# Patient Record
Sex: Female | Born: 1995 | Race: White | Hispanic: No | Marital: Single | Smoking: Never smoker
Health system: Southern US, Community
[De-identification: ages and names within clinical notes are randomized; demographics above are authoritative.]

## PROBLEM LIST (undated history)

## (undated) DIAGNOSIS — F4310 Post-traumatic stress disorder, unspecified: Secondary | ICD-10-CM

## (undated) DIAGNOSIS — L509 Urticaria, unspecified: Secondary | ICD-10-CM

## (undated) DIAGNOSIS — F419 Anxiety disorder, unspecified: Secondary | ICD-10-CM

## (undated) DIAGNOSIS — E348 Other specified endocrine disorders: Secondary | ICD-10-CM

## (undated) DIAGNOSIS — F329 Major depressive disorder, single episode, unspecified: Secondary | ICD-10-CM

## (undated) DIAGNOSIS — J45909 Unspecified asthma, uncomplicated: Secondary | ICD-10-CM

## (undated) DIAGNOSIS — E0500 Thyrotoxicosis with diffuse goiter without thyrotoxic crisis or storm: Secondary | ICD-10-CM

## (undated) DIAGNOSIS — F32A Depression, unspecified: Secondary | ICD-10-CM

## (undated) DIAGNOSIS — T7840XA Allergy, unspecified, initial encounter: Secondary | ICD-10-CM

## (undated) DIAGNOSIS — F429 Obsessive-compulsive disorder, unspecified: Secondary | ICD-10-CM

## (undated) HISTORY — DX: Post-traumatic stress disorder, unspecified: F43.10

## (undated) HISTORY — PX: TONSILLECTOMY: SUR1361

## (undated) HISTORY — DX: Urticaria, unspecified: L50.9

## (undated) HISTORY — DX: Unspecified asthma, uncomplicated: J45.909

## (undated) HISTORY — DX: Anxiety disorder, unspecified: F41.9

## (undated) HISTORY — DX: Allergy, unspecified, initial encounter: T78.40XA

## (undated) HISTORY — PX: OTHER SURGICAL HISTORY: SHX169

## (undated) HISTORY — DX: Major depressive disorder, single episode, unspecified: F32.9

## (undated) HISTORY — DX: Depression, unspecified: F32.A

---
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
## 2017-01-02 ENCOUNTER — Telehealth: Payer: Self-pay | Admitting: Pediatrics

## 2017-01-02 NOTE — Telephone Encounter (Signed)
Please send an itemized statement for last year via mail. Needed for flex card. Thanks

## 2017-06-22 ENCOUNTER — Other Ambulatory Visit: Payer: Self-pay | Admitting: Allergy & Immunology

## 2017-07-25 ENCOUNTER — Other Ambulatory Visit: Payer: Self-pay | Admitting: Allergy & Immunology

## 2017-08-22 ENCOUNTER — Ambulatory Visit
Admission: RE | Admit: 2017-08-22 | Discharge: 2017-08-22 | Disposition: A | Discharge status: Home / Self Care | Payer: Self-pay | Source: Ambulatory Visit | Attending: Family Medicine | Admitting: Family Medicine

## 2017-08-22 ENCOUNTER — Other Ambulatory Visit: Payer: Self-pay | Admitting: Family Medicine

## 2017-08-22 DIAGNOSIS — R1032 Left lower quadrant pain: Secondary | ICD-10-CM

## 2017-08-22 DIAGNOSIS — R109 Unspecified abdominal pain: Secondary | ICD-10-CM

## 2017-08-23 ENCOUNTER — Other Ambulatory Visit: Payer: Self-pay | Admitting: Family Medicine

## 2017-08-23 DIAGNOSIS — N390 Urinary tract infection, site not specified: Secondary | ICD-10-CM

## 2017-08-23 DIAGNOSIS — R1032 Left lower quadrant pain: Secondary | ICD-10-CM

## 2017-08-24 ENCOUNTER — Other Ambulatory Visit: Payer: Self-pay | Admitting: Family Medicine

## 2017-08-24 ENCOUNTER — Ambulatory Visit
Admission: RE | Admit: 2017-08-24 | Discharge: 2017-08-24 | Disposition: A | Discharge status: Home / Self Care | Payer: BLUE CROSS/BLUE SHIELD | Source: Ambulatory Visit | Attending: Family Medicine | Admitting: Family Medicine

## 2017-08-24 DIAGNOSIS — R109 Unspecified abdominal pain: Secondary | ICD-10-CM

## 2017-08-24 DIAGNOSIS — R1032 Left lower quadrant pain: Secondary | ICD-10-CM

## 2017-08-24 DIAGNOSIS — N390 Urinary tract infection, site not specified: Secondary | ICD-10-CM

## 2017-08-26 ENCOUNTER — Other Ambulatory Visit: Payer: Self-pay | Admitting: Allergy & Immunology

## 2017-10-09 ENCOUNTER — Other Ambulatory Visit: Payer: Self-pay | Admitting: Allergy & Immunology

## 2017-10-11 NOTE — Telephone Encounter (Signed)
OV due, No RX authorized.

## 2017-11-16 ENCOUNTER — Telehealth: Payer: Self-pay | Admitting: Endocrinology

## 2017-11-16 NOTE — Telephone Encounter (Signed)
This patients and her mother are wanting to come to our practice.  This is a Transgender patient (female to female)  Before the patient gets referred over to us they are wanting to know if the Dr. does Hormonal Replacement Therapy.  She is with another endocrinology office but is not sure if they specialize in this. Pt received our number through her therapist and gave us a call.   Please advise.  If patient or patients mother does not answer please leave a message.

## 2017-11-16 NOTE — Telephone Encounter (Signed)
I called and LVM that this was offered & if she had further questions before being referred she could call back.

## 2017-11-16 NOTE — Telephone Encounter (Signed)
Yes, we offer this

## 2019-11-05 IMAGING — US US PELVIS COMPLETE TRANSABD/TRANSVAG
1 series · 13 of 25 positions shown · non-contrast
Comparison: CT from 08/22/2017

CLINICAL DATA: Left lower quadrant pain



[Series 1: us pelvis complete transabd/transvag · 0.17mm/px · 13 of 73 slices shown]
[im 1/73]
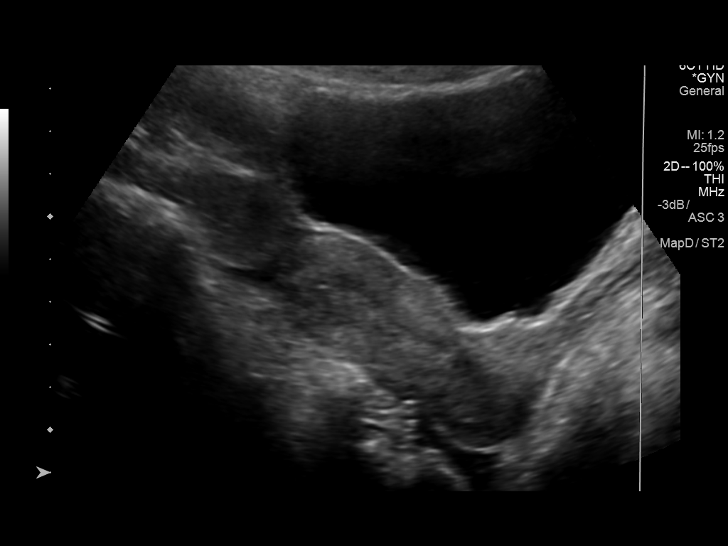
[im 7/73]
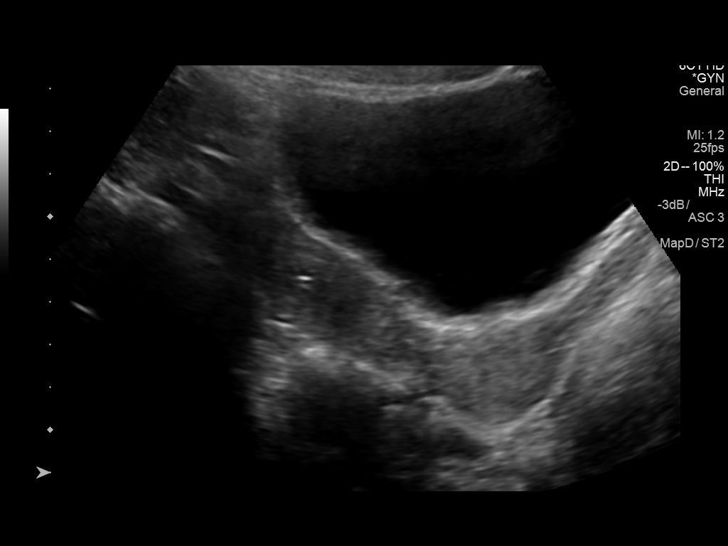
[im 13/73]
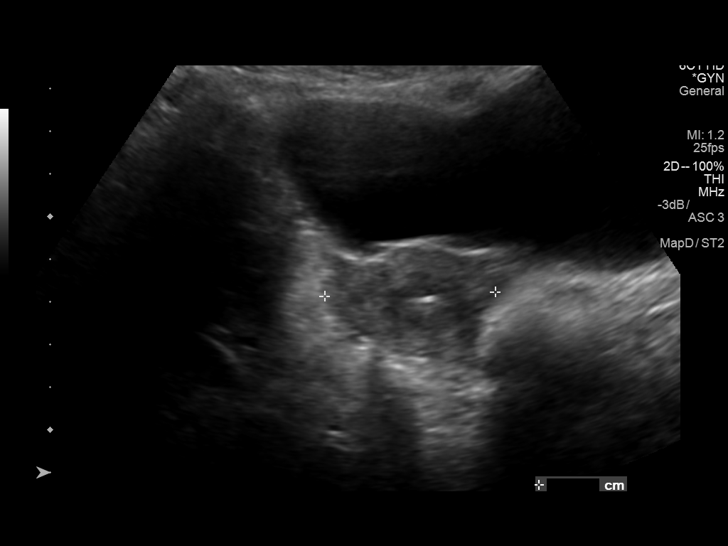
[im 19/73]
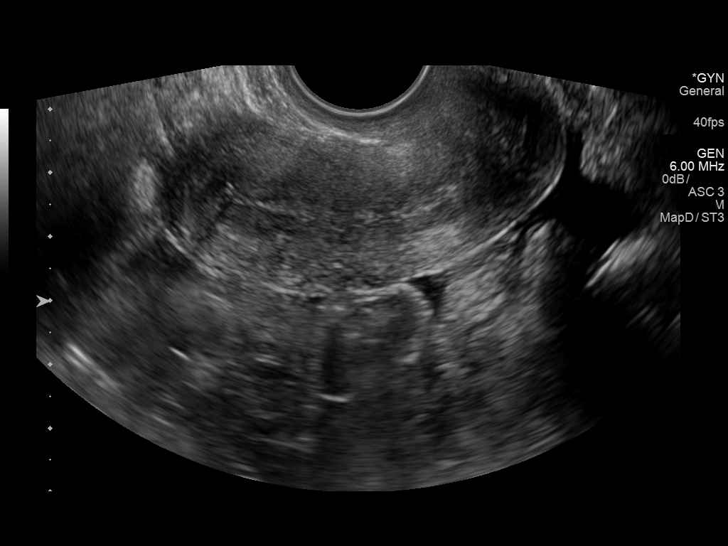
[im 25/73]
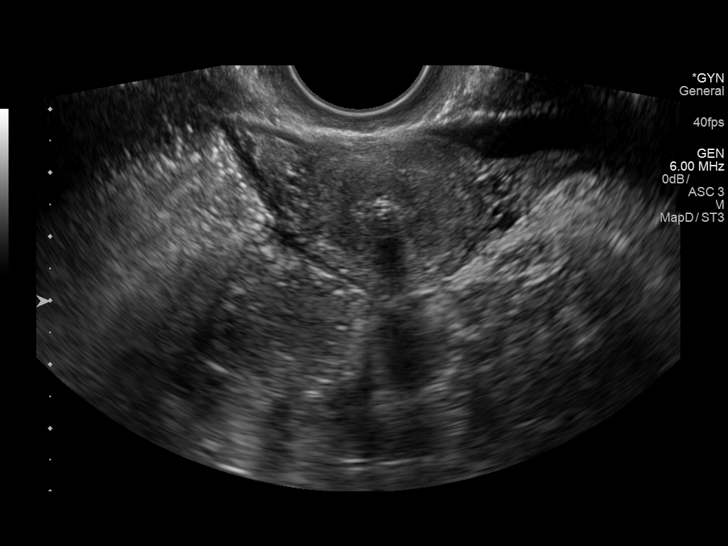
[im 31/73]
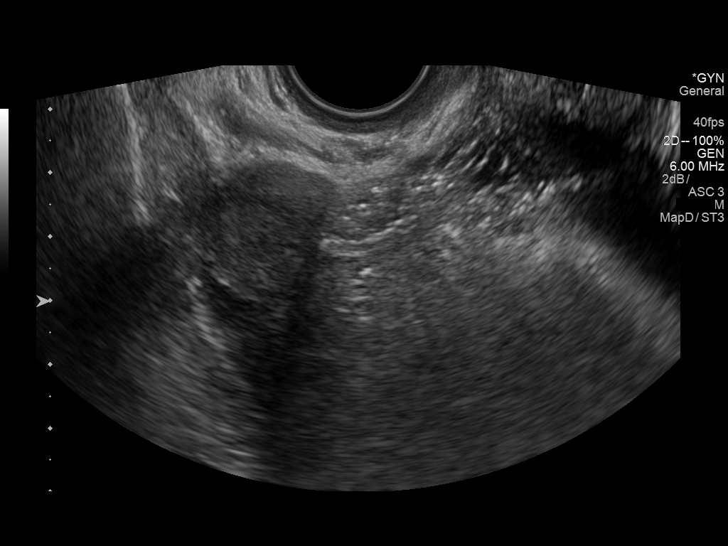
[im 37/73]
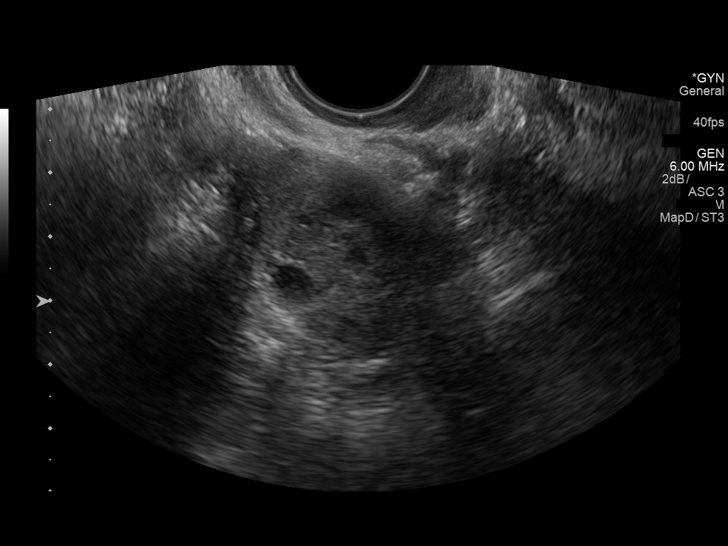
[im 43/73]
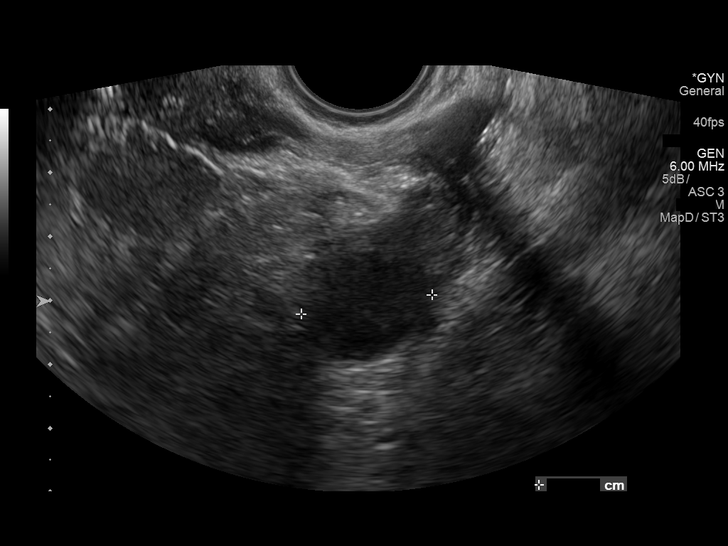
[im 49/73]
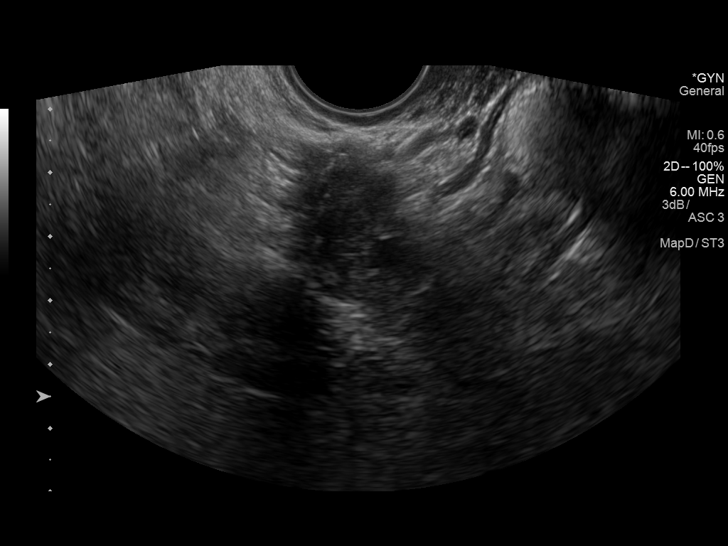
[im 55/73]
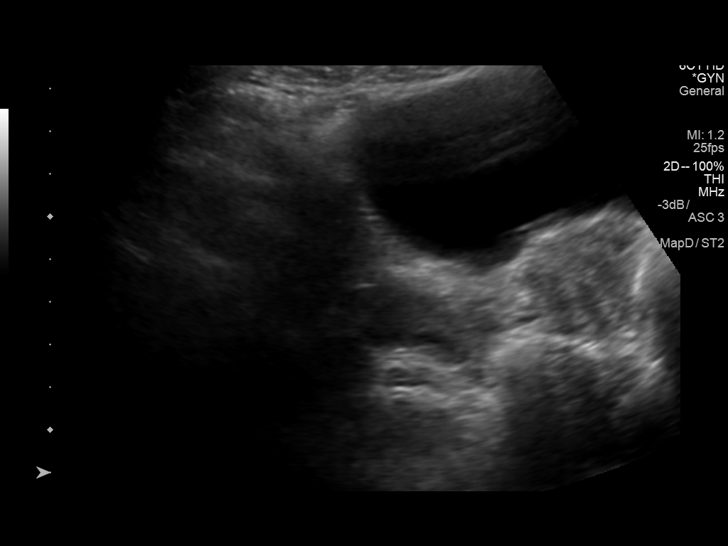
[im 61/73]
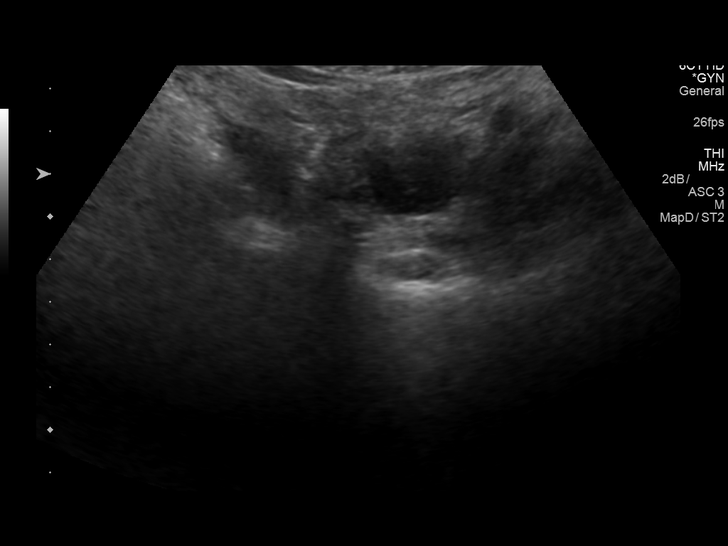
[im 67/73]
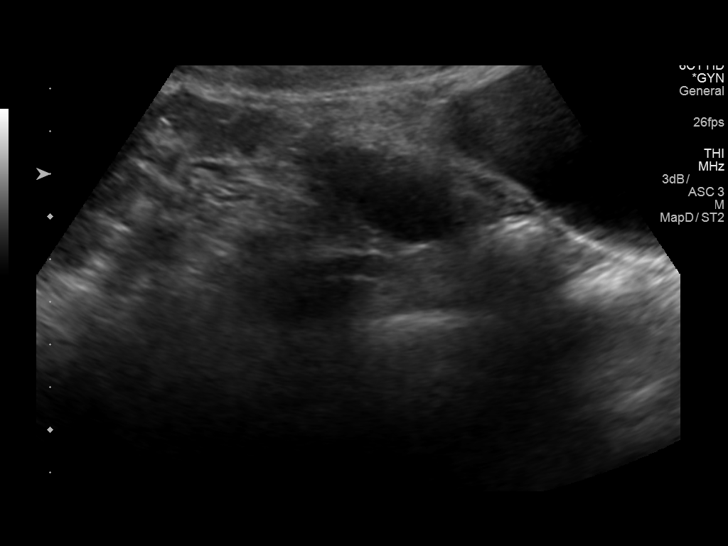
[im 73/73]
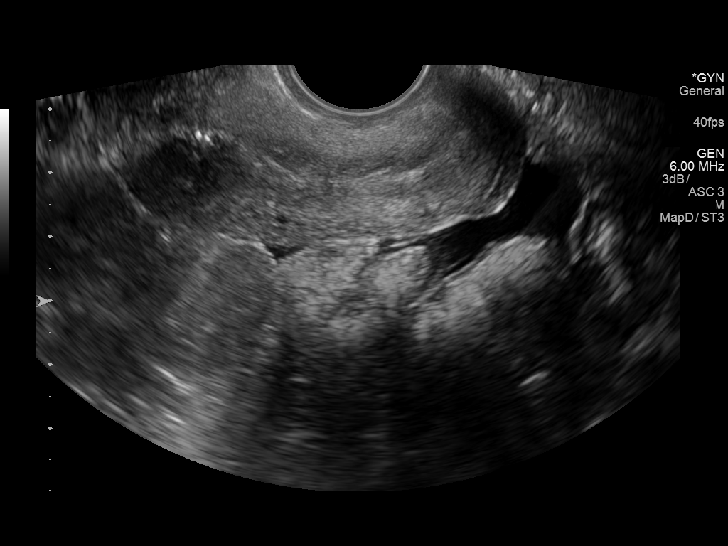

[13 of 25 positions shown; findings below may reference images not displayed]

FINDINGS: Uterus

Measurements: 6.9 x 2.6 x 4.0 cm.. No fibroids or other mass
visualized.

Endometrium

Thickness: 3.8 mm..  IUD is noted in place.

Right ovary

Measurements: 2.6 x 2.2 x 2.4 cm.. Follicular changes are noted.

Left ovary

Measurements: 3.4 x 2.1 x 3.4 cm.. Complex hypoechoic area is noted
with increased through transmission measuring 1.8 x 1.7 x 2.1 cm.
This was not as well visualized on the prior CT exam and likely
represents a complex, possibly hemorrhagic cyst.

Other findings

Mild free fluid is noted likely physiologic in nature given the
patient's age.
IMPRESSION: Complex cystic lesion in the left ovary measuring 2.1 cm. This is
likely of benign etiology given the size and patient's premenopausal
state. Follow-up examination in 2-3 months is recommended to assess
for resolution.

Minimal free pelvic fluid likely physiologic in nature.

## 2019-11-05 IMAGING — US US RENAL
1 series · 14 of 25 positions shown · non-contrast
Comparison: CT 08/22/2017

CLINICAL DATA: 21-year-old female with left flank and lower
quadrant pain for 3 days.

EXAM:
RENAL / URINARY TRACT ULTRASOUND COMPLETE

[Series 1: us renal · 0.21mm/px · 14 of 33 slices shown]
[im 1/33]
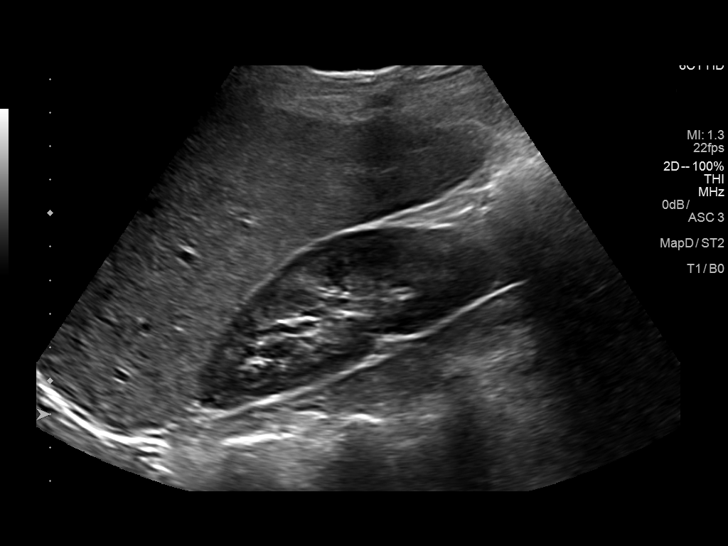
[im 3/33]
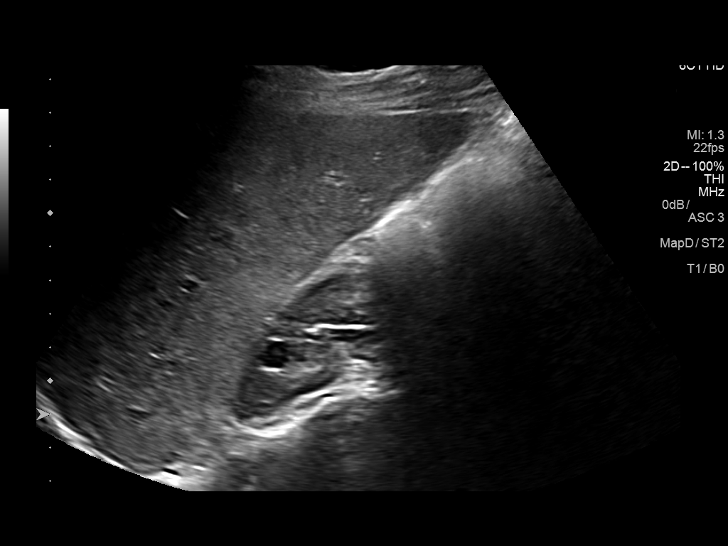
[im 6/33]
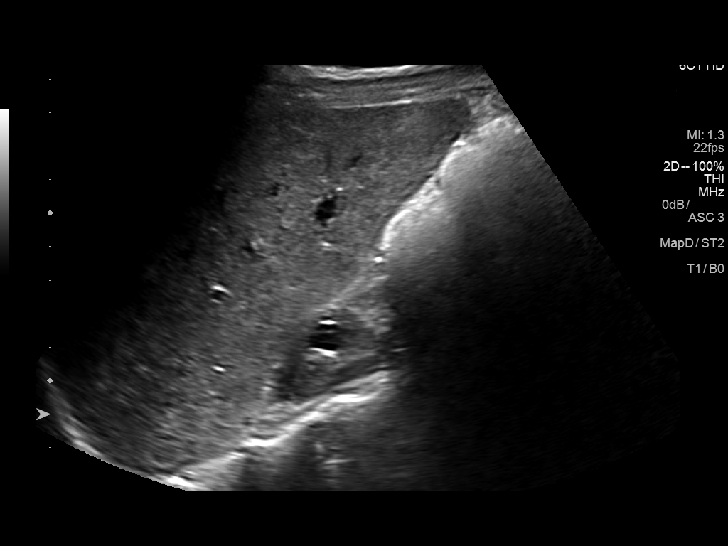
[im 9/33]
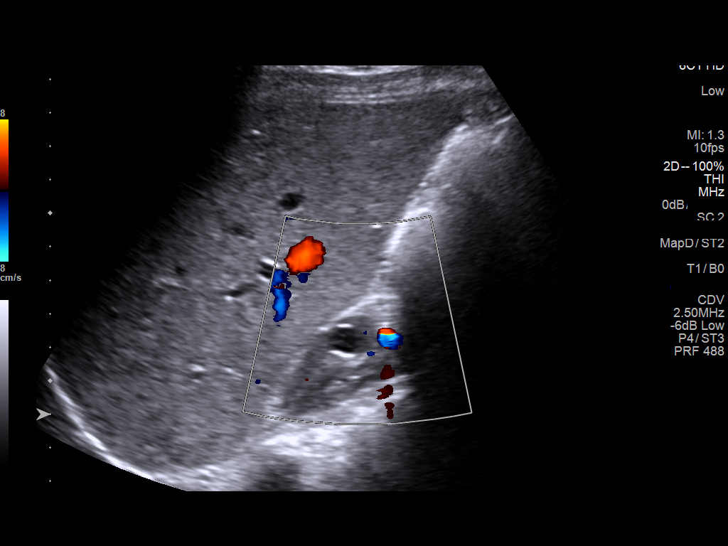
[im 11/33]
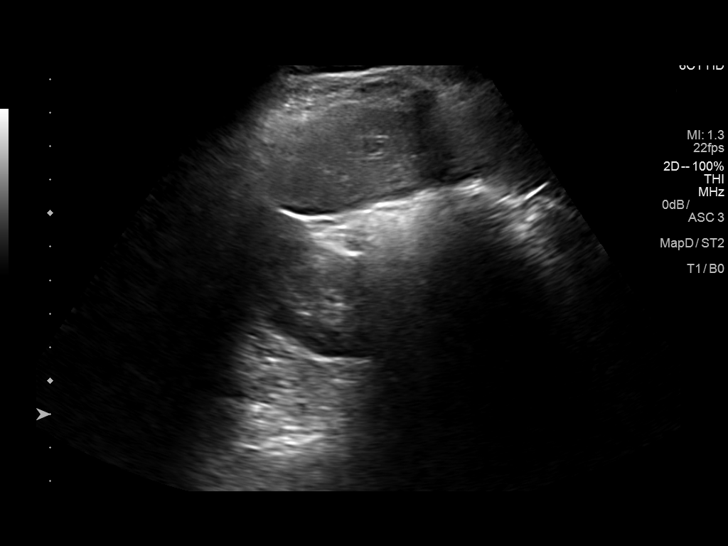
[im 13/33]
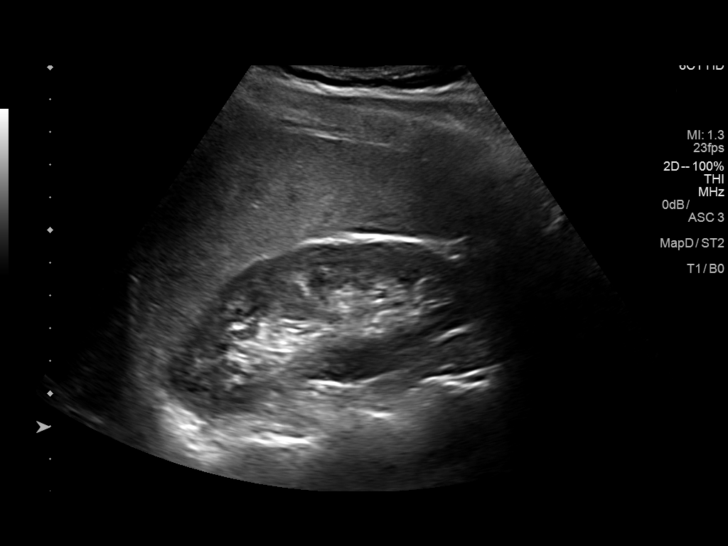
[im 15/33]
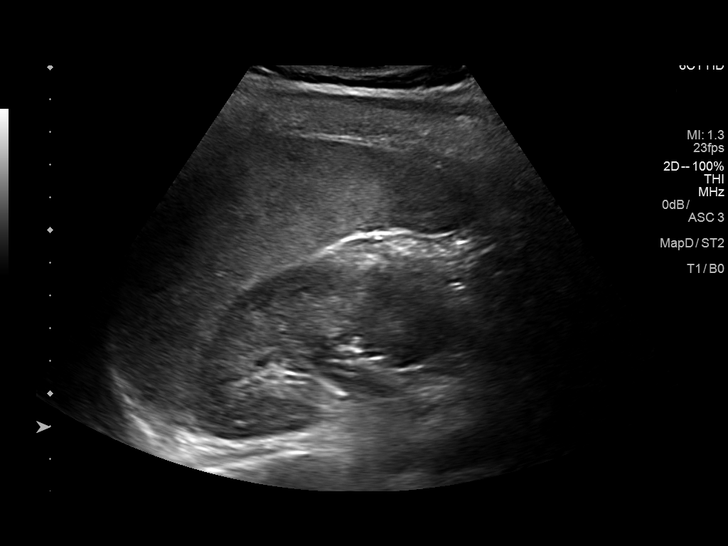
[im 18/33]
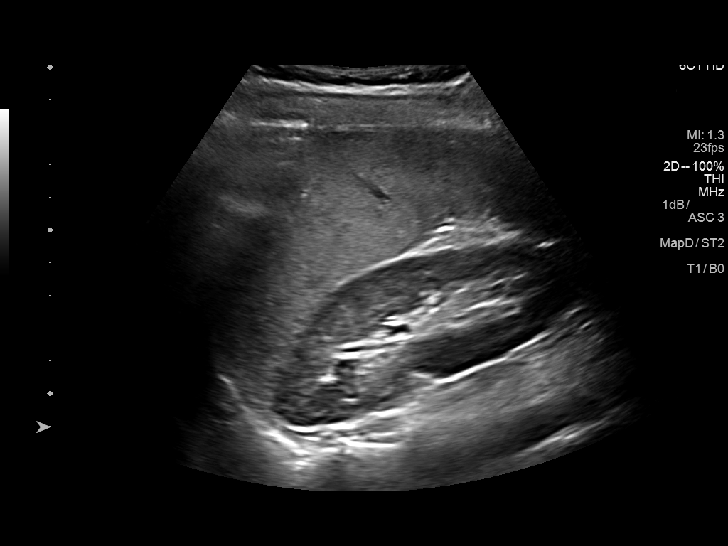
[im 21/33]
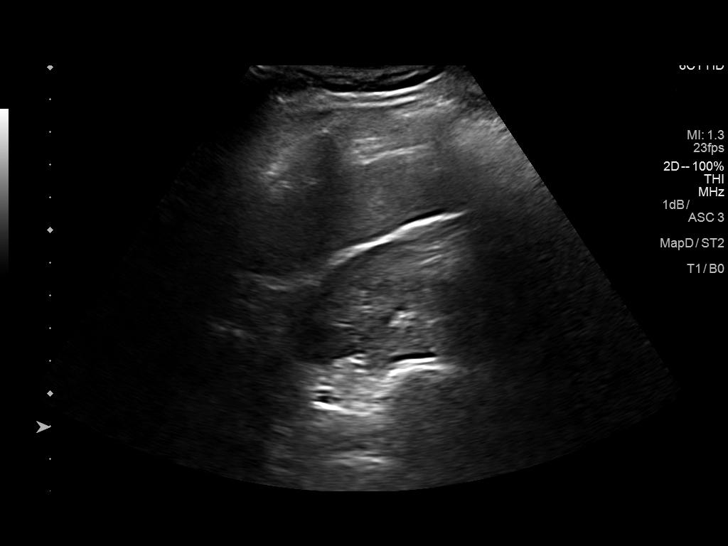
[im 22/33]
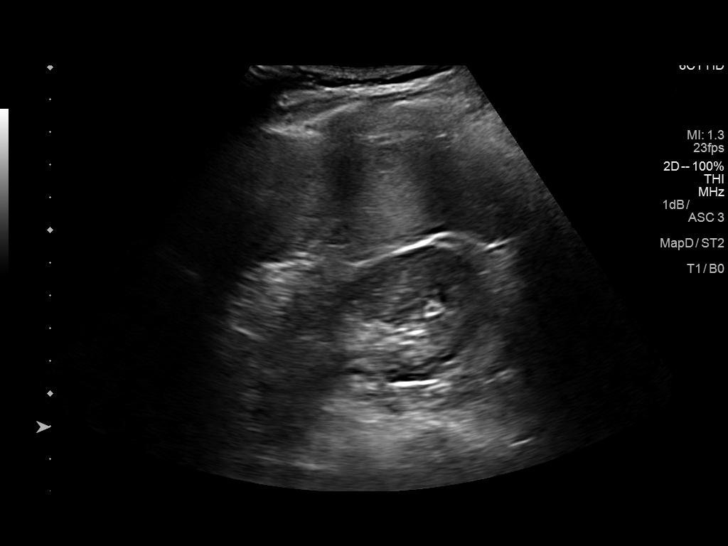
[im 25/33]
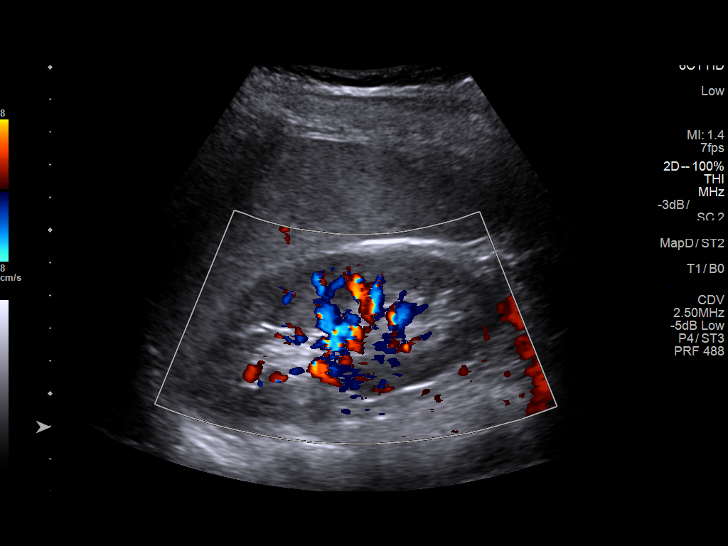
[im 27/33]
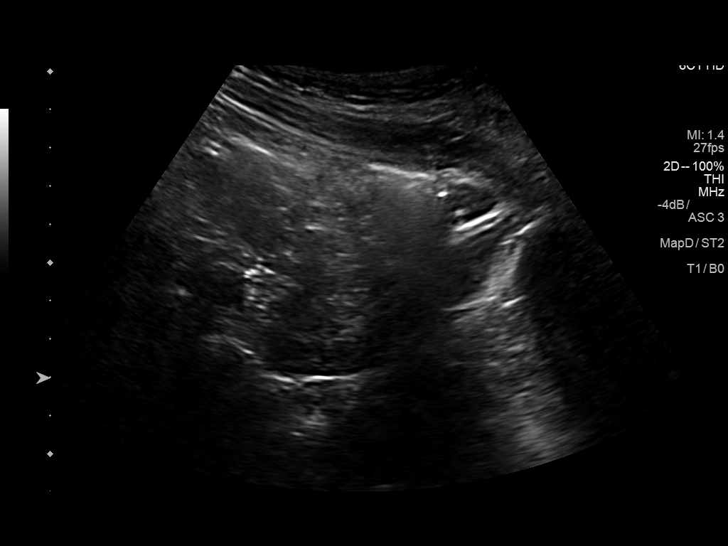
[im 30/33]
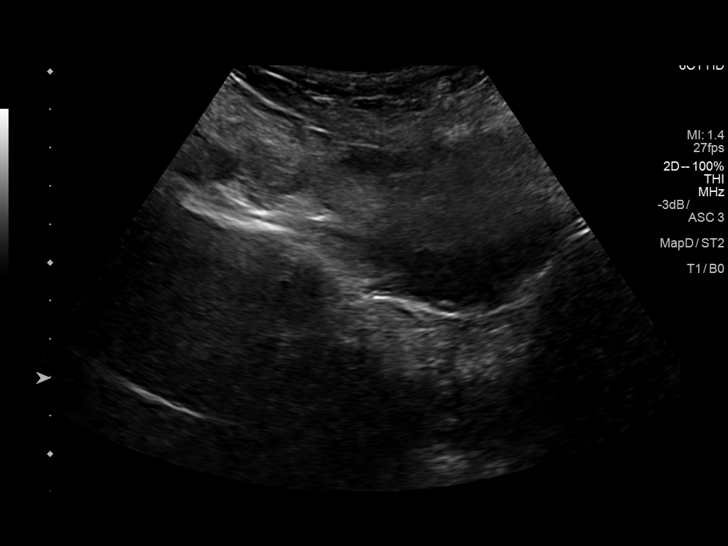
[im 33/33]
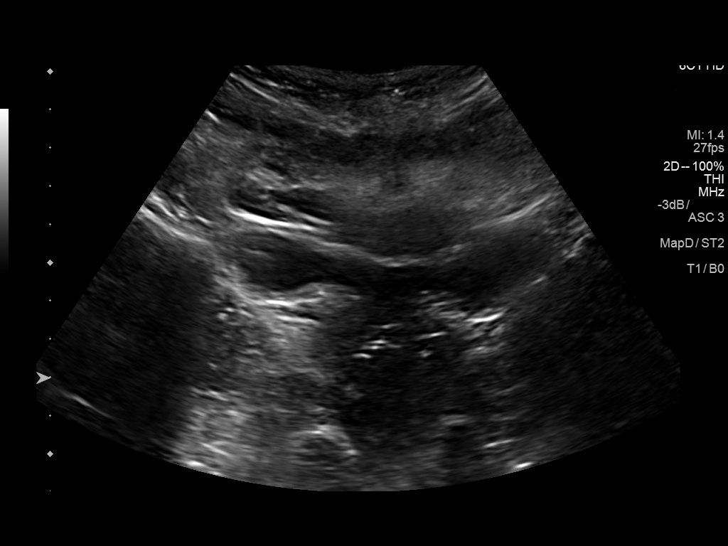

[14 of 25 positions shown; findings below may reference images not displayed]

FINDINGS: Right Kidney:

Length: 10.0 cm. Echogenicity within normal limits. A 1 x 1 x 0.9 cm
cyst is noted at the upper pole. No solid mass or hydronephrosis
visualized.

Left Kidney:

Length: 10.2 cm. Echogenicity within normal limits. No mass or
hydronephrosis visualized.

Bladder:

Decompressed and therefore not well evaluated.
IMPRESSION: 1. No evidence for hydronephrosis or other acute renal abnormality.
2. Limited evaluation of the bladder due to decompressed state.
